# Patient Record
Sex: Male | Born: 2000 | Race: Black or African American | Hispanic: No | Marital: Single | Smoking: Never smoker
Health system: Southern US, Community
[De-identification: ages and names within clinical notes are randomized; demographics above are authoritative.]

## PROBLEM LIST (undated history)

## (undated) DIAGNOSIS — Z91018 Allergy to other foods: Secondary | ICD-10-CM

## (undated) DIAGNOSIS — L509 Urticaria, unspecified: Secondary | ICD-10-CM

## (undated) HISTORY — DX: Allergy to other foods: Z91.018

## (undated) HISTORY — PX: OTHER SURGICAL HISTORY: SHX169

## (undated) HISTORY — DX: Urticaria, unspecified: L50.9

---
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
## 2022-05-30 ENCOUNTER — Encounter (HOSPITAL_BASED_OUTPATIENT_CLINIC_OR_DEPARTMENT_OTHER): Payer: Self-pay | Admitting: Emergency Medicine

## 2022-05-30 ENCOUNTER — Emergency Department (HOSPITAL_BASED_OUTPATIENT_CLINIC_OR_DEPARTMENT_OTHER): Payer: Federal, State, Local not specified - PPO

## 2022-05-30 ENCOUNTER — Other Ambulatory Visit: Payer: Self-pay

## 2022-05-30 ENCOUNTER — Observation Stay (HOSPITAL_BASED_OUTPATIENT_CLINIC_OR_DEPARTMENT_OTHER)
Admission: EM | Admit: 2022-05-30 | Discharge: 2022-06-01 | Disposition: A | Discharge status: Home / Self Care | Payer: Federal, State, Local not specified - PPO | Attending: Internal Medicine | Admitting: Internal Medicine

## 2022-05-30 DIAGNOSIS — W268XXA Contact with other sharp object(s), not elsewhere classified, initial encounter: Secondary | ICD-10-CM | POA: Insufficient documentation

## 2022-05-30 DIAGNOSIS — Z79899 Other long term (current) drug therapy: Secondary | ICD-10-CM | POA: Diagnosis not present

## 2022-05-30 DIAGNOSIS — Z91010 Allergy to peanuts: Secondary | ICD-10-CM | POA: Diagnosis not present

## 2022-05-30 DIAGNOSIS — L03113 Cellulitis of right upper limb: Principal | ICD-10-CM | POA: Insufficient documentation

## 2022-05-30 DIAGNOSIS — L089 Local infection of the skin and subcutaneous tissue, unspecified: Secondary | ICD-10-CM | POA: Diagnosis present

## 2022-05-30 DIAGNOSIS — L0390 Cellulitis, unspecified: Secondary | ICD-10-CM | POA: Diagnosis present

## 2022-05-30 LAB — CBC WITH DIFFERENTIAL/PLATELET
Abs Immature Granulocytes: 0.02 10*3/uL (ref 0.00–0.07)
Basophils Absolute: 0.1 10*3/uL (ref 0.0–0.1)
Basophils Relative: 1 %
Eosinophils Absolute: 0.1 10*3/uL (ref 0.0–0.5)
Eosinophils Relative: 2 %
HCT: 40.9 % (ref 39.0–52.0)
Hemoglobin: 13.9 g/dL (ref 13.0–17.0)
Immature Granulocytes: 0 %
Lymphocytes Relative: 16 %
Lymphs Abs: 1.5 10*3/uL (ref 0.7–4.0)
MCH: 30.2 pg (ref 26.0–34.0)
MCHC: 34 g/dL (ref 30.0–36.0)
MCV: 88.9 fL (ref 80.0–100.0)
Monocytes Absolute: 0.8 10*3/uL (ref 0.1–1.0)
Monocytes Relative: 9 %
Neutro Abs: 6.9 10*3/uL (ref 1.7–7.7)
Neutrophils Relative %: 72 %
Platelets: 330 10*3/uL (ref 150–400)
RBC: 4.6 MIL/uL (ref 4.22–5.81)
RDW: 12.6 % (ref 11.5–15.5)
WBC: 9.5 10*3/uL (ref 4.0–10.5)
nRBC: 0 % (ref 0.0–0.2)

## 2022-05-30 LAB — BASIC METABOLIC PANEL
Anion gap: 6 (ref 5–15)
BUN: 19 mg/dL (ref 6–20)
CO2: 25 mmol/L (ref 22–32)
Calcium: 8.9 mg/dL (ref 8.9–10.3)
Chloride: 106 mmol/L (ref 98–111)
Creatinine, Ser: 1.27 mg/dL — ABNORMAL HIGH (ref 0.61–1.24)
GFR, Estimated: >60 mL/min (ref >60)
Glucose, Bld: 97 mg/dL (ref 70–99)
Potassium: 3.5 mmol/L (ref 3.5–5.1)
Sodium: 137 mmol/L (ref 135–145)

## 2022-05-30 MED ORDER — CEFAZOLIN SODIUM-DEXTROSE 1-4 GM/50ML-% IV SOLN: 1.0000 g | Freq: Once | INTRAVENOUS | Status: DC

## 2022-05-30 MED ORDER — SODIUM CHLORIDE 0.9 % IV SOLN: INTRAVENOUS | Status: DC | PRN

## 2022-05-30 MED ORDER — CEFAZOLIN SODIUM-DEXTROSE 1-4 GM/50ML-% IV SOLN
1.0000 g | Freq: Once | INTRAVENOUS | Status: AC
  Administered 2022-05-30: 1 g via INTRAVENOUS
  Filled 2022-05-30: qty 50

## 2022-05-30 MED ORDER — CEFAZOLIN SODIUM-DEXTROSE 1-4 GM/50ML-% IV SOLN
1.0000 g | Freq: Once | INTRAVENOUS | Status: DC
  Administered 2022-05-30: 1 g via INTRAVENOUS
  Filled 2022-05-30: qty 50

## 2022-05-30 NOTE — ED Provider Notes (Signed)
[facility] EMERGENCY DEPARTMENT Provider Note   CSN: [id number] Arrival date & time: 05/30/22  2116     History  Chief Complaint  Patient presents with   Wound Infection    Curtis Crawford is a 21 y.o. male.  Curtis Crawford is a 21 year old male with no significant past medical history who presents with worsening redness and pruritus of a small wound on his ventral thenar eminence he received from a piece of cardboard yesterday.  Since receiving his COVID he has gone to resting practice and has noticed that there is a red tract that goes from the wound up his forearm and almost into his axilla.  He denies any worsening pain or purulent drainage.  He is also not having any weakness or numbness in his left arm or hand.  He denies any systemic infective symptoms.        Home Medications Prior to Admission medications   Medication Sig Start Date End Date Taking? Authorizing Provider  EPINEPHrine (AUVI-Q) 0.3 mg/0.3 mL IJ SOAJ injection Use as directed for severe allergic reaction. 01/30/17   Bobbitt, Heywood Iles, MD  levocetirizine (XYZAL) 5 MG tablet Take 1 tablet (5 mg total) by mouth every evening. 01/30/17   Bobbitt, Heywood Iles, MD  loratadine (CLARITIN) 10 MG tablet Take by mouth.    [provider]  mometasone (NASONEX) 50 MCG/ACT nasal spray 1 spray per nostril 1-2 times daily as needed. 01/30/17   Bobbitt, Heywood Iles, MD  Olopatadine HCl (PATADAY) 0.2 % SOLN One drop each eye twice a day as needed for itchy eyes. 02/11/17   Bobbitt, Heywood Iles, MD      Allergies    Peanut-containing drug products and Strawberry (diagnostic)    Review of Systems   Review of Systems  Constitutional:  Negative for chills and fever.  Respiratory:  Negative for cough and shortness of breath.   Cardiovascular:  Negative for chest pain and palpitations.  Gastrointestinal:  Negative for abdominal pain, constipation, diarrhea, nausea and vomiting.  Genitourinary:   Negative for dysuria and hematuria.  Musculoskeletal:  Negative for arthralgias and myalgias.  Skin:  Positive for rash.  Neurological:  Negative for dizziness, syncope, weakness, light-headedness and numbness.    Physical Exam Updated Vital Signs BP 125/68 (BP Location: Left Arm)   Pulse 80   Temp 98.7 F (37.1 C) (Oral)   Resp 18   Ht 5\' 6"  (1.676 m)   Wt 71.2 kg   SpO2 97%   BMI 25.34 kg/m  Physical Exam Vitals reviewed.  Constitutional:      General: He is not in acute distress.    Appearance: Normal appearance. He is normal weight.  Lymphadenopathy:     Upper Body:     Right upper body: Axillary adenopathy and pectoral adenopathy present. No supraclavicular adenopathy.     Left upper body: No axillary or pectoral adenopathy.     Comments: Tenderness to palpation over the right pectoral/axillary lymph area without obvious palpable lymphadenopathy.  Skin:    Comments: 0.5 cm laceration over the right ventral thenar eminence just proximal to the MTP joint with overlying scab and surrounding edema and erythema.  There is also lymphatic tracking from this laceration and surrounding erythema up to the proximal humerus but not quite into the axilla.  Neurological:     Mental Status: He is alert.     ED Results / Procedures / Treatments   Labs (all labs ordered are listed, but only abnormal results  are displayed) Labs Reviewed  BASIC METABOLIC PANEL - Abnormal; Notable for the following components:      Result Value   Creatinine, Ser 1.27 (*)    All other components within normal limits  CBC WITH DIFFERENTIAL/PLATELET    EKG None  Radiology DG Hand Complete Right  Result Date: 05/30/2022 CLINICAL DATA:  Right thumb laceration. EXAM: RIGHT HAND - COMPLETE 3+ VIEW COMPARISON:  None Available. FINDINGS: There is no evidence of fracture or dislocation. There is no evidence of arthropathy or other focal bone abnormality. Soft tissues are unremarkable. IMPRESSION: Negative.  Electronically Signed   By: Aram Candela M.D.   On: 05/30/2022 23:08    Procedures Procedures    Medications Ordered in ED Medications  0.9 %  sodium chloride infusion ( Intravenous New Bag/Given 05/30/22 2232)  ceFAZolin (ANCEF) IVPB 1 g/50 mL premix (1 g Intravenous New Bag/Given 05/30/22 2323)    ED Course/ Medical Decision Making/ A&P                           Medical Decision Making Curtis Crawford is a 21 year old male who presents with cellulitis complicated by lymphatic tracking after small laceration on his right thumb from a carpet box less than 2 days ago.  No signs of systemic infection at this point but the lymphatic tracking has been rapid and he is having some tenderness over his axillary and pectoral lymph nodes.  He has no history of immune deficiency.  Last known tetanus vaccination was in 2016.  Overall the patient is stable at this point but with his rapidly progressing cellulitis infection with lymphatic tracking he will need observation as well as IV antibiotics.  Erythematous area has been marked with a purple marker at 10 PM on 05/30/2022.  We have started him on 2 g of cefazolin and ordered an x-ray of the right hand to evaluate for any underlying air or foreign bodies.  Lab work shows elevated creatinine 1.27 but GFR remains above 60 and there were no other abnormality seen on CBC or BMP.  We will contact hospitalist for admission.  Spoke to Dr. Antionette Char who will accept the patient for admission.  Problems Addressed: Cellulitis of right upper extremity: acute illness or injury Cellulitis with lymphangitis: acute illness or injury  Amount and/or Complexity of Data Reviewed Labs: ordered. Radiology: ordered.  Risk Prescription drug management. Decision regarding hospitalization.          Final Clinical Impression(s) / ED Diagnoses Final diagnoses:  Cellulitis of right upper extremity  Cellulitis with lymphangitis    Rx / DC Orders ED Discharge Orders      None         Rocky Morel, DO 05/30/22 2332    Melene Plan, DO 05/30/22 2340

## 2022-05-30 NOTE — ED Notes (Signed)
ED TO INPATIENT HANDOFF REPORT  ED Nurse Name and Phone #:   S Name/Age/Gender Curtis Crawford 21 y.o. male Room/Bed: MH09/MH09  Code Status   Code Status: Not on file  Home/SNF/Other Home Patient oriented to: self, place, time, and situation Is this baseline? Yes   Triage Complete: Triage complete  Chief Complaint Cellulitis [L03.90]  Triage Note Patient states he cut his right inner thumb on a cardboard box yesterday and then went to wrestling practice. Patient now has redness around wound and a streak going up from the wound up to his upper arm.    Allergies Allergies  Allergen Reactions   Peanut-Containing Drug Products     hives   Strawberry (Diagnostic)     hives    Level of Care/Admitting Diagnosis ED Disposition     ED Disposition  Admit   Condition  --   Comment  Hospital Area: [facility] [[id number]]  Level of Care: Med-Surg [16]  Interfacility transfer: Yes  May place patient in observation at [facility] or [facility] if equivalent level of care is available:: Yes  Covid Evaluation: Asymptomatic - no recent exposure (last 10 days) testing not required  Diagnosis: Cellulitis [220254]  Admitting Physician: Briscoe Deutscher [[id number]]  Attending Physician: Briscoe Deutscher [[id number]]          B Medical/Surgery History Past Medical History:  Diagnosis Date   Food allergy    Urticaria    Past Surgical History:  Procedure Laterality Date   no past surgery       A IV Location/Drains/Wounds Patient Lines/Drains/Airways Status     Active Line/Drains/Airways     Name Placement date Placement time Site Days   Peripheral IV 05/30/22 20 G 1" Left Antecubital 05/30/22  2226  Antecubital  less than 1            Intake/Output Last 24 hours No intake or output data in the 24 hours ending 05/30/22 2352  Labs/Imaging Results for orders placed or performed during the hospital encounter of 05/30/22 (from the past 48 hour(s))   Basic metabolic panel     Status: Abnormal   Collection Time: 05/30/22 10:27 PM  Result Value Ref Range   Sodium 137 135 - 145 mmol/L   Potassium 3.5 3.5 - 5.1 mmol/L   Chloride 106 98 - 111 mmol/L   CO2 25 22 - 32 mmol/L   Glucose, Bld 97 70 - 99 mg/dL    Comment: Glucose reference range applies only to samples taken after fasting for at least 8 hours.   BUN 19 6 - 20 mg/dL   Creatinine, Ser 7.61 (H) 0.61 - 1.24 mg/dL   Calcium 8.9 8.9 - 60.7 mg/dL   GFR, Estimated >37 >10 mL/min    Comment: (NOTE) Calculated using the CKD-EPI Creatinine Equation (2021)    Anion gap 6 5 - 15    Comment: Performed at [facility], [address] [place], [place] [address]  CBC with Differential     Status: None   Collection Time: 05/30/22 10:27 PM  Result Value Ref Range   WBC 9.5 4.0 - 10.5 K/uL   RBC 4.60 4.22 - 5.81 MIL/uL   Hemoglobin 13.9 13.0 - 17.0 g/dL   HCT 85.4 62.7 - 03.5 %   MCV 88.9 80.0 - 100.0 fL   MCH 30.2 26.0 - 34.0 pg   MCHC 34.0 30.0 - 36.0 g/dL   RDW 00.9 38.1 - 82.9 %  Platelets 330 150 - 400 K/uL   nRBC 0.0 0.0 - 0.2 %   Neutrophils Relative % 72 %   Neutro Abs 6.9 1.7 - 7.7 K/uL   Lymphocytes Relative 16 %   Lymphs Abs 1.5 0.7 - 4.0 K/uL   Monocytes Relative 9 %   Monocytes Absolute 0.8 0.1 - 1.0 K/uL   Eosinophils Relative 2 %   Eosinophils Absolute 0.1 0.0 - 0.5 K/uL   Basophils Relative 1 %   Basophils Absolute 0.1 0.0 - 0.1 K/uL   Immature Granulocytes 0 %   Abs Immature Granulocytes 0.02 0.00 - 0.07 K/uL    Comment: Performed at [facility], [address] [place], [place] [address]   DG Hand Complete Right  Result Date: 05/30/2022 CLINICAL DATA:  Right thumb laceration. EXAM: RIGHT HAND - COMPLETE 3+ VIEW COMPARISON:  None Available. FINDINGS: There is no evidence of fracture or dislocation. There is no evidence of arthropathy or other focal bone abnormality. Soft tissues are unremarkable. IMPRESSION: Negative. Electronically  Signed   By: Aram Candela M.D.   On: 05/30/2022 23:08    Pending Labs Unresulted Labs (From admission, onward)    None       Vitals/Pain Today's Vitals   05/30/22 2128 05/30/22 2129  BP:  125/68  Pulse:  80  Resp:  18  Temp:  98.7 F (37.1 C)  TempSrc:  Oral  SpO2:  97%  Weight: 157 lb (71.2 kg)   Height: 5\' 6"  (1.676 m)   PainSc: 1      Isolation Precautions No active isolations  Medications Medications  0.9 %  sodium chloride infusion ( Intravenous New Bag/Given 05/30/22 2232)  ceFAZolin (ANCEF) IVPB 1 g/50 mL premix (1 g Intravenous New Bag/Given 05/30/22 2323)    Mobility walks Low fall risk   Focused Assessments    R Recommendations: See Admitting Provider Note  Report given to:   Additional Notes:

## 2022-05-30 NOTE — ED Triage Notes (Signed)
Patient states he cut his right inner thumb on a cardboard box yesterday and then went to wrestling practice. Patient now has redness around wound and a streak going up from the wound up to his upper arm.

## 2022-05-31 ENCOUNTER — Encounter (HOSPITAL_COMMUNITY): Payer: Self-pay | Admitting: Internal Medicine

## 2022-05-31 DIAGNOSIS — L03113 Cellulitis of right upper limb: Secondary | ICD-10-CM | POA: Diagnosis not present

## 2022-05-31 DIAGNOSIS — Z79899 Other long term (current) drug therapy: Secondary | ICD-10-CM | POA: Diagnosis not present

## 2022-05-31 DIAGNOSIS — Z91010 Allergy to peanuts: Secondary | ICD-10-CM | POA: Diagnosis not present

## 2022-05-31 DIAGNOSIS — W268XXA Contact with other sharp object(s), not elsewhere classified, initial encounter: Secondary | ICD-10-CM | POA: Diagnosis not present

## 2022-05-31 DIAGNOSIS — L089 Local infection of the skin and subcutaneous tissue, unspecified: Secondary | ICD-10-CM | POA: Diagnosis present

## 2022-05-31 LAB — CBC WITH DIFFERENTIAL/PLATELET
Abs Immature Granulocytes: 0.04 10*3/uL (ref 0.00–0.07)
Basophils Absolute: 0.1 10*3/uL (ref 0.0–0.1)
Basophils Relative: 1 %
Eosinophils Absolute: 0.2 10*3/uL (ref 0.0–0.5)
Eosinophils Relative: 2 %
HCT: 41.2 % (ref 39.0–52.0)
Hemoglobin: 13.9 g/dL (ref 13.0–17.0)
Immature Granulocytes: 0 %
Lymphocytes Relative: 21 %
Lymphs Abs: 2 10*3/uL (ref 0.7–4.0)
MCH: 30.5 pg (ref 26.0–34.0)
MCHC: 33.7 g/dL (ref 30.0–36.0)
MCV: 90.4 fL (ref 80.0–100.0)
Monocytes Absolute: 0.8 10*3/uL (ref 0.1–1.0)
Monocytes Relative: 8 %
Neutro Abs: 6.5 10*3/uL (ref 1.7–7.7)
Neutrophils Relative %: 68 %
Platelets: 321 10*3/uL (ref 150–400)
RBC: 4.56 MIL/uL (ref 4.22–5.81)
RDW: 12.8 % (ref 11.5–15.5)
WBC: 9.6 10*3/uL (ref 4.0–10.5)
nRBC: 0 % (ref 0.0–0.2)

## 2022-05-31 LAB — COMPREHENSIVE METABOLIC PANEL
ALT: 19 U/L (ref 0–44)
AST: 20 U/L (ref 15–41)
Albumin: 4 g/dL (ref 3.5–5.0)
Alkaline Phosphatase: 58 U/L (ref 38–126)
Anion gap: 9 (ref 5–15)
BUN: 21 mg/dL — ABNORMAL HIGH (ref 6–20)
CO2: 24 mmol/L (ref 22–32)
Calcium: 9 mg/dL (ref 8.9–10.3)
Chloride: 109 mmol/L (ref 98–111)
Creatinine, Ser: 1.11 mg/dL (ref 0.61–1.24)
GFR, Estimated: >60 mL/min (ref >60)
Glucose, Bld: 97 mg/dL (ref 70–99)
Potassium: 3.9 mmol/L (ref 3.5–5.1)
Sodium: 142 mmol/L (ref 135–145)
Total Bilirubin: 0.6 mg/dL (ref 0.3–1.2)
Total Protein: 6.7 g/dL (ref 6.5–8.1)

## 2022-05-31 LAB — MAGNESIUM: Magnesium: 2.3 mg/dL (ref 1.7–2.4)

## 2022-05-31 MED ORDER — DIPHENHYDRAMINE HCL 25 MG PO CAPS
25.0000 mg | ORAL_CAPSULE | Freq: Four times a day (QID) | ORAL | Status: DC | PRN
  Administered 2022-05-31 (×2): 25 mg via ORAL
  Filled 2022-05-31 (×2): qty 1

## 2022-05-31 MED ORDER — ONDANSETRON HCL 4 MG/2ML IJ SOLN: 4.0000 mg | Freq: Four times a day (QID) | INTRAMUSCULAR | Status: DC | PRN

## 2022-05-31 MED ORDER — HYDROCODONE-ACETAMINOPHEN 5-325 MG PO TABS: 1.0000 | ORAL_TABLET | Freq: Four times a day (QID) | ORAL | Status: DC | PRN

## 2022-05-31 MED ORDER — KETOROLAC TROMETHAMINE 30 MG/ML IJ SOLN: 30.0000 mg | Freq: Four times a day (QID) | INTRAMUSCULAR | Status: DC | PRN

## 2022-05-31 MED ORDER — CEFAZOLIN SODIUM-DEXTROSE 1-4 GM/50ML-% IV SOLN
1.0000 g | Freq: Three times a day (TID) | INTRAVENOUS | Status: DC
Start: 2022-05-31 — End: 2022-06-01
  Administered 2022-05-31 – 2022-06-01 (×4): 1 g via INTRAVENOUS
  Filled 2022-05-31 (×4): qty 50

## 2022-05-31 MED ORDER — NALOXONE HCL 0.4 MG/ML IJ SOLN: 0.4000 mg | INTRAMUSCULAR | Status: DC | PRN

## 2022-05-31 MED ORDER — ACETAMINOPHEN 650 MG RE SUPP: 650.0000 mg | Freq: Four times a day (QID) | RECTAL | Status: DC | PRN

## 2022-05-31 MED ORDER — KETOROLAC TROMETHAMINE 30 MG/ML IJ SOLN
30.0000 mg | Freq: Once | INTRAMUSCULAR | Status: AC
  Administered 2022-05-31: 30 mg via INTRAVENOUS
  Filled 2022-05-31: qty 1

## 2022-05-31 MED ORDER — ACETAMINOPHEN 325 MG PO TABS
650.0000 mg | ORAL_TABLET | Freq: Four times a day (QID) | ORAL | Status: DC | PRN
  Administered 2022-05-31: 650 mg via ORAL
  Filled 2022-05-31: qty 2

## 2022-05-31 MED ORDER — KETOROLAC TROMETHAMINE 30 MG/ML IJ SOLN: 30.0000 mg | Freq: Four times a day (QID) | INTRAMUSCULAR | Status: DC

## 2022-05-31 NOTE — Progress Notes (Signed)
Patient just arrived to the unit. Paged Admitting.

## 2022-05-31 NOTE — ED Notes (Signed)
Report to [facility] 

## 2022-05-31 NOTE — Progress Notes (Signed)
The patient is requesting Benadryl for itching. Notified Pearson Grippe.

## 2022-05-31 NOTE — Progress Notes (Signed)
  Carryover admission to the Day Admitter. Please see Dr. Criss Rosales transfer documentation in [facility] Communication for additional details.   This is a 65 M admitted as transfer from [place] ED for cellulitis of the right upper extremity, following recent small cut to the right hand, suspected to represent portal of entry.  No SIRS criteria met.  No reported purulent drainage.  Plain films of right upper extremity showed no evidence of subcutaneous gas to suggest necrotizing fasciitis.  Started on Ancef at [facility].   Patient accepted by Dr. Myna Hidalgo for observation to [facility] for further evaluation / management of right upper extremity cellulitis.  I have placed some additional preliminary admit orders via the adult multi-morbid admission order set. I have also ordered as needed Norco, as needed Zofran, as well as orders for morning labs.  Additionally, in the setting of reported nonpurulent cellulitis of mild to moderate severity, I have ordered continuation of Ancef.     Babs Bertin, DO Hospitalist

## 2022-05-31 NOTE — H&P (Signed)
History and Physical    Patient: Curtis Crawford POE:[id number] DOB: 19-Jun-2001 DOA: 05/30/2022 DOS: the patient was seen and examined on 05/31/2022 PCP: [facility]  Patient coming from: Home  Chief Complaint:  Chief Complaint  Patient presents with   Wound Infection   HPI: Curtis Crawford is a 21 y.o. male with medical history significant of food allergy, urticaria, Osgood-Schlatter's disease, left scapula fracture, left distal radius fracture who presented yesterday evening to the emergency department with complaints of cardboard cut off the right proximal MCP area just above the thumb that he suffered yesterday.  After this happened, the patient went to wrestling practice.  Subsequently in the evening, the wound developed erythema, edema, TTP and calor.  Erythema is streaking from the wound area all the way up to his axilla. He denied fever, chills, rhinorrhea, sore throat, wheezing or hemoptysis.  No chest pain, palpitations, diaphoresis, PND, orthopnea or pitting edema of the lower extremities.  No abdominal pain, nausea, emesis, diarrhea, constipation, melena or hematochezia.  No flank pain, dysuria, frequency or hematuria.  No polyuria, polydipsia, polyphagia or blurred vision.   ED course: Initial vital signs were temperature 98.7 F, pulse 80, respiration 18, BP 125/68 mmHg O2 sat 97% on room air.  Patient was started on cefazolin.  Lab work: CBC was normal with a white count of 9.5 with 72% neutrophils, hemoglobin 13.9 g/dL platelets 315.  BMP was normal except for creatinine of 1.27 mg/dL.  However, the patient wrestles and lifts weights.  Imaging: Right hand x-ray was negative.   Review of Systems: As mentioned in the history of present illness. All other systems reviewed and are negative. Past Medical History:  Diagnosis Date   Food allergy    Urticaria    Past Surgical History:  Procedure Laterality Date   no past surgery     Social History:  reports that  he has never smoked. He has never used smokeless tobacco. He reports that he does not drink alcohol and does not use drugs.  Allergies  Allergen Reactions   Peanut-Containing Drug Products     hives   Strawberry (Diagnostic)     hives    Family History  Problem Relation Age of Onset   Asthma Paternal Uncle    Allergic rhinitis Neg Hx    Angioedema Neg Hx    Eczema Neg Hx    Immunodeficiency Neg Hx    Urticaria Neg Hx     Prior to Admission medications   Medication Sig Start Date End Date Taking? Authorizing Provider  EPINEPHrine (AUVI-Q) 0.3 mg/0.3 mL IJ SOAJ injection Use as directed for severe allergic reaction. 01/30/17  Yes Bobbitt, Heywood Iles, MD  ketoconazole (NIZORAL) 2 % shampoo Apply 1 Application topically 2 (two) times a week. 05/15/22  Yes [provider]  levocetirizine (XYZAL) 5 MG tablet Take 1 tablet (5 mg total) by mouth every evening. Patient taking differently: Take 5 mg by mouth at bedtime as needed for allergies. 01/30/17  Yes Bobbitt, Heywood Iles, MD  mometasone (NASONEX) 50 MCG/ACT nasal spray 1 spray per nostril 1-2 times daily as needed. Patient taking differently: Place 2 sprays into the nose daily as needed (nasal congestion). 01/30/17  Yes Bobbitt, Heywood Iles, MD  Olopatadine HCl (PATADAY) 0.2 % SOLN One drop each eye twice a day as needed for itchy eyes. 02/11/17  Yes Bobbitt, Heywood Iles, MD  Pseudoeph-Doxylamine-DM-APAP (NYQUIL MULTI-SYMPTOM PO) Take 1 capsule by mouth at bedtime as needed (allergy symptoms).  Yes [provider]    Physical Exam: Vitals:   05/30/22 2128 05/30/22 2129 05/31/22 0200 05/31/22 0655  BP:  125/68 111/61   Pulse:  80 64   Resp:  18 15   Temp:  98.7 F (37.1 C) 98.5 F (36.9 C)   TempSrc:  Oral Oral   SpO2:  97% 98%   Weight: 71.2 kg   76.6 kg  Height: 5\' 6"  (1.676 m)   5\' 6"  (1.676 m)   Physical Exam Vitals and nursing note reviewed.  Constitutional:      General: He is awake.      Appearance: Normal appearance. He is well-developed. He is not ill-appearing.  HENT:     Head: Normocephalic.     Mouth/Throat:     Mouth: Mucous membranes are moist.  Eyes:     General: No scleral icterus.    Pupils: Pupils are equal, round, and reactive to light.  Cardiovascular:     Rate and Rhythm: Normal rate and regular rhythm.  Abdominal:     General: Bowel sounds are normal. There is no distension.     Palpations: Abdomen is soft.     Tenderness: There is no abdominal tenderness. There is no right CVA tenderness, left CVA tenderness or guarding.  Musculoskeletal:     Cervical back: Neck supple.  Skin:    General: Skin is warm and dry.  Neurological:     General: No focal deficit present.     Mental Status: He is alert and oriented to person, place, and time.  Psychiatric:        Mood and Affect: Mood normal.        Behavior: Behavior normal. Behavior is cooperative.      Data Reviewed:  There are no new results to review at this time.  Assessment and Plan: Principal Problem:   Cellulitis Observation/MedSurg. Continue cefazolin 1 g IVPB daily. Continue Norco 03/06/2024 every 6 hours as needed. Toradol 30 mg IVP x1 dose. Toradol 30 mg IVP every 6 hours as needed afterwards. Follow-up CBC and chemistry in the morning.     Advance Care Planning:   Code Status: Full Code   Consults:   Family Communication:   Severity of Illness: The appropriate patient status for this patient is OBSERVATION. Observation status is judged to be reasonable and necessary in order to provide the required intensity of service to ensure the patient's safety. The patient's presenting symptoms, physical exam findings, and initial radiographic and laboratory data in the context of their medical condition is felt to place them at decreased risk for further clinical deterioration. Furthermore, it is anticipated that the patient will be medically stable for discharge from the hospital within 2  midnights of admission.   Author: , MD 05/31/2022 8:00 AM  For on call review [email or web address].   This document was prepared using Dragon voice recognition software and may contain some unintended transcription errors.

## 2022-06-01 DIAGNOSIS — L03113 Cellulitis of right upper limb: Secondary | ICD-10-CM | POA: Diagnosis not present

## 2022-06-01 MED ORDER — CEPHALEXIN 500 MG PO CAPS
500.0000 mg | ORAL_CAPSULE | Freq: Three times a day (TID) | ORAL | 0 refills | Status: AC
Start: 2022-06-01 — End: 2022-06-06

## 2022-06-01 NOTE — Discharge Summary (Signed)
Physician Discharge Summary  Curtis Crawford ONG:[id number] DOB: 2001-03-22 DOA: 05/30/2022  PCP: [facility]  Admit date: 05/30/2022 Discharge date: 06/01/2022  Admitted From: Home Disposition:  Home  Discharge Condition:Stable CODE STATUS:FULL Diet recommendation:  Regular   Brief/Interim Summary:  Curtis Crawford is a 21 y.o. male with medical history significant of food allergy, urticaria, Osgood-Schlatter's disease, left scapula fracture, left distal radius fracture who presented yesterday evening to the emergency department with complaints of cardboard cut off the right proximal MCP area just above the thumb .  He developed erythema, edema from the wound area all the way up to axilla.  No fever or chills.  On presentation, he was hemodynamically stable.  X-ray of the right and was negative for acute findings.  Patient was started on cefazolin. Cellulitis has almost resolved this morning.  Pain, erythema resolved.  He is medically stable for discharge home with oral antibiotics.   Discharge Diagnoses:  Principal Problem:   Cellulitis    Discharge Instructions  Discharge Instructions     Diet - low sodium heart healthy   Complete by: As directed    Discharge instructions   Complete by: As directed    1)Please take prescribed medications as instructed   Increase activity slowly   Complete by: As directed       Allergies as of 06/01/2022       Reactions   Peanut-containing Drug Products    hives   Strawberry (diagnostic)    hives        Medication List     TAKE these medications    cephALEXin 500 MG capsule Commonly known as: KEFLEX Take 1 capsule (500 mg total) by mouth 3 (three) times daily for 5 days.   EPINEPHrine 0.3 mg/0.3 mL Soaj injection Commonly known as: Auvi-Q Use as directed for severe allergic reaction.   ketoconazole 2 % shampoo Commonly known as: NIZORAL Apply 1 Application topically 2 (two) times a week.   levocetirizine  5 MG tablet Commonly known as: Xyzal Take 1 tablet (5 mg total) by mouth every evening. What changed:  when to take this reasons to take this   mometasone 50 MCG/ACT nasal spray Commonly known as: Nasonex 1 spray per nostril 1-2 times daily as needed. What changed:  how much to take how to take this when to take this reasons to take this additional instructions   NYQUIL MULTI-SYMPTOM PO Take 1 capsule by mouth at bedtime as needed (allergy symptoms).   Olopatadine HCl 0.2 % Soln Commonly known as: Pataday One drop each eye twice a day as needed for itchy eyes.        Follow-up Information     [facility]. Schedule an appointment as soon as possible for a visit in 1 week(s).   Contact information: [address] [place] [address] [phone]                Allergies  Allergen Reactions   Peanut-Containing Drug Products     hives   Strawberry (Diagnostic)     hives    Consultations: None   Procedures/Studies: DG Hand Complete Right  Result Date: 05/30/2022 CLINICAL DATA:  Right thumb laceration. EXAM: RIGHT HAND - COMPLETE 3+ VIEW COMPARISON:  None Available. FINDINGS: There is no evidence of fracture or dislocation. There is no evidence of arthropathy or other focal bone abnormality. Soft tissues are unremarkable. IMPRESSION: Negative. Electronically Signed   By: Aram Candela M.D.   On:  05/30/2022 23:08      Subjective: Patient seen and examined the bedside this morning.  Hemodynamically stable for discharge today.  Discharge Exam: Vitals:   05/31/22 2201 06/01/22 0458  BP: (!) 125/58 (!) 130/56  Pulse: 74 67  Resp: 18 17  Temp: 98.3 F (36.8 C) 97.7 F (36.5 C)  SpO2: 100% 99%   Vitals:   05/31/22 1407 05/31/22 1708 05/31/22 2201 06/01/22 0458  BP: 130/61 (!) 122/56 (!) 125/58 (!) 130/56  Pulse: 70 71 74 67  Resp: 18 14 18 17   Temp: 98.2 F (36.8 C) (!) 97.4 F (36.3 C) 98.3 F (36.8 C) 97.7 F (36.5 C)   TempSrc: Oral Oral    SpO2: 98% 99% 100% 99%  Weight:      Height:        General: Pt is alert, awake, not in acute distress Cardiovascular: RRR, S1/S2 +, no rubs, no gallops Respiratory: CTA bilaterally, no wheezing, no rhonchi Abdominal: Soft, NT, ND, bowel sounds + Extremities: no edema, no cyanosis    The results of significant diagnostics from this hospitalization (including imaging, microbiology, ancillary and laboratory) are listed below for reference.     Microbiology: No results found for this or any previous visit (from the past 240 hour(s)).   Labs: BNP (last 3 results) No results for input(s): "BNP" in the last 8760 hours. Basic Metabolic Panel: Recent Labs  Lab 05/30/22 2227 05/31/22 0325  NA 137 142  K 3.5 3.9  CL 106 109  CO2 25 24  GLUCOSE 97 97  BUN 19 21*  CREATININE 1.27* 1.11  CALCIUM 8.9 9.0  MG  --  2.3   Liver Function Tests: Recent Labs  Lab 05/31/22 0325  AST 20  ALT 19  ALKPHOS 58  BILITOT 0.6  PROT 6.7  ALBUMIN 4.0   No results for input(s): "LIPASE", "AMYLASE" in the last 168 hours. No results for input(s): "AMMONIA" in the last 168 hours. CBC: Recent Labs  Lab 05/30/22 2227 05/31/22 0325  WBC 9.5 9.6  NEUTROABS 6.9 6.5  HGB 13.9 13.9  HCT 40.9 41.2  MCV 88.9 90.4  PLT 330 321   Cardiac Enzymes: No results for input(s): "CKTOTAL", "CKMB", "CKMBINDEX", "TROPONINI" in the last 168 hours. BNP: Invalid input(s): "POCBNP" CBG: No results for input(s): "GLUCAP" in the last 168 hours. D-Dimer No results for input(s): "DDIMER" in the last 72 hours. Hgb A1c No results for input(s): "HGBA1C" in the last 72 hours. Lipid Profile No results for input(s): "CHOL", "HDL", "LDLCALC", "TRIG", "CHOLHDL", "LDLDIRECT" in the last 72 hours. Thyroid function studies No results for input(s): "TSH", "T4TOTAL", "T3FREE", "THYROIDAB" in the last 72 hours.  Invalid input(s): "FREET3" Anemia work up No results for input(s):  "VITAMINB12", "FOLATE", "FERRITIN", "TIBC", "IRON", "RETICCTPCT" in the last 72 hours. Urinalysis No results found for: "COLORURINE", "APPEARANCEUR", "LABSPEC", "PHURINE", "GLUCOSEU", "HGBUR", "BILIRUBINUR", "KETONESUR", "PROTEINUR", "UROBILINOGEN", "NITRITE", "LEUKOCYTESUR" Sepsis Labs Recent Labs  Lab 05/30/22 2227 05/31/22 0325  WBC 9.5 9.6   Microbiology No results found for this or any previous visit (from the past 240 hour(s)).  Please note: You were cared for by a hospitalist during your hospital stay. Once you are discharged, your primary care physician will handle any further medical issues. Please note that NO REFILLS for any discharge medications will be authorized once you are discharged, as it is imperative that you return to your primary care physician (or establish a relationship with a primary care physician if you do not have one) for  your post hospital discharge needs so that they can reassess your need for medications and monitor your lab values.    Time coordinating discharge: 40 minutes  SIGNED:   Burnadette Pop, MD  [facility] 06/01/2022, 10:26 AM Pager [phone]  If 7PM-7AM, please contact night-coverage [email or web address] Password [email or web address]

## 2022-06-01 NOTE — Progress Notes (Signed)
RN reviewed discharge instructions with patient and family. All questions answered.   Paperwork given. Prescriptions electronically sent to patient pharmacy.    NT walked with patient down to family vehicle.     Deneise Lever, RN

## 2022-06-01 NOTE — TOC CM/SW Note (Signed)
  Transition of Care [facility]) Screening Note   Patient Details  Name: Curtis Crawford Date of Birth: 2001-04-18   Transition of Care [facility]) CM/SW Contact:    Darleene Cleaver, LCSW Phone Number: 06/01/2022, 10:37 AM    Transition of Care Department [facility]) has reviewed patient and no TOC needs have been identified at this time. We will continue to monitor patient advancement through interdisciplinary progression rounds. If new patient transition needs arise, please place a TOC consult.
